# Patient Record
Sex: Female | Born: 2000 | Race: White | Hispanic: No | Marital: Single | Smoking: Never smoker
Health system: Southern US, Community
[De-identification: ages and names within clinical notes are randomized; demographics above are authoritative.]

## PROBLEM LIST (undated history)

## (undated) DIAGNOSIS — J45990 Exercise induced bronchospasm: Secondary | ICD-10-CM

## (undated) HISTORY — PX: RECONSTRUCTION OF NOSE: SHX2301

---
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
## 2021-06-26 ENCOUNTER — Encounter (HOSPITAL_BASED_OUTPATIENT_CLINIC_OR_DEPARTMENT_OTHER): Payer: Self-pay | Admitting: Emergency Medicine

## 2021-06-26 ENCOUNTER — Other Ambulatory Visit: Payer: Self-pay

## 2021-06-26 ENCOUNTER — Emergency Department (HOSPITAL_BASED_OUTPATIENT_CLINIC_OR_DEPARTMENT_OTHER)
Admission: EM | Admit: 2021-06-26 | Discharge: 2021-06-27 | Disposition: A | Discharge status: Home / Self Care | Payer: 59 | Attending: Emergency Medicine | Admitting: Emergency Medicine

## 2021-06-26 DIAGNOSIS — J45909 Unspecified asthma, uncomplicated: Secondary | ICD-10-CM | POA: Insufficient documentation

## 2021-06-26 DIAGNOSIS — T7840XA Allergy, unspecified, initial encounter: Secondary | ICD-10-CM

## 2021-06-26 DIAGNOSIS — Z20822 Contact with and (suspected) exposure to covid-19: Secondary | ICD-10-CM | POA: Insufficient documentation

## 2021-06-26 DIAGNOSIS — L509 Urticaria, unspecified: Secondary | ICD-10-CM | POA: Insufficient documentation

## 2021-06-26 DIAGNOSIS — R21 Rash and other nonspecific skin eruption: Secondary | ICD-10-CM | POA: Insufficient documentation

## 2021-06-26 DIAGNOSIS — T3695XA Adverse effect of unspecified systemic antibiotic, initial encounter: Secondary | ICD-10-CM | POA: Insufficient documentation

## 2021-06-26 DIAGNOSIS — T50905A Adverse effect of unspecified drugs, medicaments and biological substances, initial encounter: Secondary | ICD-10-CM

## 2021-06-26 LAB — CBC WITH DIFFERENTIAL/PLATELET
Abs Immature Granulocytes: 0.02 10*3/uL (ref 0.00–0.07)
Basophils Absolute: 0 10*3/uL (ref 0.0–0.1)
Basophils Relative: 1 %
Eosinophils Absolute: 0.3 10*3/uL (ref 0.0–0.5)
Eosinophils Relative: 6 %
HCT: 38.7 % (ref 36.0–46.0)
Hemoglobin: 13.3 g/dL (ref 12.0–15.0)
Immature Granulocytes: 1 %
Lymphocytes Relative: 24 %
Lymphs Abs: 1 10*3/uL (ref 0.7–4.0)
MCH: 31.1 pg (ref 26.0–34.0)
MCHC: 34.4 g/dL (ref 30.0–36.0)
MCV: 90.4 fL (ref 80.0–100.0)
Monocytes Absolute: 0.6 10*3/uL (ref 0.1–1.0)
Monocytes Relative: 14 %
Neutro Abs: 2.3 10*3/uL (ref 1.7–7.7)
Neutrophils Relative %: 54 %
Platelets: 307 10*3/uL (ref 150–400)
RBC: 4.28 MIL/uL (ref 3.87–5.11)
RDW: 12.3 % (ref 11.5–15.5)
WBC: 4.2 10*3/uL (ref 4.0–10.5)
nRBC: 0 % (ref 0.0–0.2)

## 2021-06-26 LAB — RESP PANEL BY RT-PCR (FLU A&B, COVID) ARPGX2
Influenza A by PCR: NEGATIVE
Influenza B by PCR: NEGATIVE
SARS Coronavirus 2 by RT PCR: NEGATIVE

## 2021-06-26 MED ORDER — FAMOTIDINE IN NACL 20-0.9 MG/50ML-% IV SOLN
20.0000 mg | Freq: Once | INTRAVENOUS | Status: AC
  Administered 2021-06-26: 20 mg via INTRAVENOUS
  Filled 2021-06-26: qty 50

## 2021-06-26 MED ORDER — ACETAMINOPHEN 325 MG PO TABS
650.0000 mg | ORAL_TABLET | Freq: Once | ORAL | Status: AC
  Administered 2021-06-26: 650 mg via ORAL
  Filled 2021-06-26: qty 2

## 2021-06-26 MED ORDER — METHYLPREDNISOLONE SODIUM SUCC 125 MG IJ SOLR
125.0000 mg | Freq: Once | INTRAMUSCULAR | Status: AC
  Administered 2021-06-26: 125 mg via INTRAVENOUS
  Filled 2021-06-26: qty 2

## 2021-06-26 NOTE — ED Triage Notes (Signed)
°  Patient comes in with allergic reaction that started earlier this morning.  Patient had scattered hives on arms, face, and legs.  Patient also states she has had chills since 1500 this afternoon and is febrile in triage to 100.8  Patient took benadryl at 1700 and helped hives.  States she has had sinus infection off and on for about a month.  No pain, but feels tired/weak.

## 2021-06-26 NOTE — ED Provider Notes (Signed)
[facility] EMERGENC[facility] DEPT Provider Note  CSN: [id number] Arrival date & time: 06/26/21 2050  Chief Complaint(s) Allergic Reaction and Fever  HPI Heather Frost is a 21 y.o. female    Allergic Reaction Presenting symptoms: itching and rash   Presenting symptoms: no difficulty breathing, no difficulty swallowing, no swelling and no wheezing   Presenting symptoms comment:  Fever Severity:  Moderate Duration:  5 hours Context: medications (finished 10 day course of Bactrim for recurring sinusitis)   Relieved by:  Nothing Worsened by:  Nothing Fever Associated symptoms: rash    Past Medical History Past Medical History:  Diagnosis Date   Asthma, exercise induced    Patient Active Problem List   Diagnosis Date Noted   Asthma, exercise induced    Home Medication(s) Prior to Admission medications   Medication Sig Start Date End Date Taking? Authorizing Provider  calcium carbonate (TUMS - DOSED IN MG ELEMENTAL CALCIUM) 500 MG chewable tablet Chew 1 tablet by mouth daily as needed for indigestion or heartburn.    [provider]  diphenhydrAMINE (BENADRYL) 25 MG tablet Take 1 tablet (25 mg total) by mouth every 6 (six) hours for 5 days. 06/27/21 07/02/21  Fatima Blank, MD  EPINEPHrine 0.3 mg/0.3 mL IJ SOAJ injection Inject 0.3 mg into the muscle as needed for anaphylaxis. 06/27/21   Fatima Blank, MD  famotidine (PEPCID) 20 MG tablet Take 1 tablet (20 mg total) by mouth daily for 5 days. 06/27/21 07/02/21  Fatima Blank, MD  Multiple Vitamin (MULTIVITAMIN WITH MINERALS) TABS tablet Take 1 tablet by mouth daily.    [provider]  predniSONE (DELTASONE) 10 MG tablet Take 4 tablets (40 mg total) by mouth daily for 4 days. 06/27/21 07/01/21  Fatima Blank, MD                                                                                                                                    Allergies Bactrim  [sulfamethoxazole-trimethoprim] and Penicillins  Review of Systems Review of Systems  Constitutional:  Positive for fever.  HENT:  Negative for trouble swallowing.   Respiratory:  Negative for wheezing.   Skin:  Positive for itching and rash.  As noted in HPI  Physical Exam Vital Signs  I have reviewed the triage vital signs BP 103/63    Pulse 81    Temp 99.3 F (37.4 C) (Oral)    Resp 18    Ht 5\' 4"  (1.626 m)    Wt 58.1 kg    LMP 06/26/2021    SpO2 99%    BMI 21.97 kg/m   Physical Exam Vitals reviewed.  Constitutional:      General: She is not in acute distress.    Appearance: She is well-developed. She is not diaphoretic.  HENT:     Head: Normocephalic and atraumatic.     Nose: Nose normal.     Mouth/Throat:  Lips: No lesions.     Mouth: No oral lesions or angioedema.     Dentition: No gum lesions.     Palate: No lesions.     Pharynx: No posterior oropharyngeal erythema.  Eyes:     General: No scleral icterus.       Right eye: No discharge.        Left eye: No discharge.     Conjunctiva/sclera: Conjunctivae normal.     Pupils: Pupils are equal, round, and reactive to light.  Cardiovascular:     Rate and Rhythm: Normal rate and regular rhythm.     Heart sounds: No murmur heard.   No friction rub. No gallop.  Pulmonary:     Effort: Pulmonary effort is normal. No respiratory distress.     Breath sounds: Normal breath sounds. No stridor. No rales.  Abdominal:     General: There is no distension.     Palpations: Abdomen is soft.     Tenderness: There is no abdominal tenderness.  Musculoskeletal:        General: No tenderness.     Cervical back: Normal range of motion and neck supple.  Skin:    General: Skin is warm and dry.     Findings: Rash present. No erythema. Rash is urticarial.  Neurological:     Mental Status: She is alert and oriented to person, place, and time.    ED Results and Treatments Labs (all labs ordered are listed, but only abnormal  results are displayed) Labs Reviewed  COMPREHENSIVE METABOLIC PANEL - Abnormal; Notable for the following components:      Result Value   Sodium 133 (*)    CO2 21 (*)    Glucose, Bld 114 (*)    Creatinine, Ser 1.07 (*)    All other components within normal limits  RESP PANEL BY RT-PCR (FLU A&B, COVID) ARPGX2  CBC WITH DIFFERENTIAL/PLATELET                                                                                                                         EKG  EKG Interpretation  Date/Time:    Ventricular Rate:    PR Interval:    QRS Duration:   QT Interval:    QTC Calculation:   R Axis:     Text Interpretation:         Radiology No results found.  Pertinent labs & imaging results that were available during my care of the patient were reviewed by me and considered in my medical decision making (see MDM for details).  Medications Ordered in ED Medications  acetaminophen (TYLENOL) tablet 650 mg (650 mg Oral Given 06/26/21 2109)  famotidine (PEPCID) IVPB 20 mg premix (0 mg Intravenous Stopped 06/27/21 0020)  methylPREDNISolone sodium succinate (SOLU-MEDROL) 125 mg/2 mL injection 125 mg (125 mg Intravenous Given 06/26/21 2344)  Procedures Procedures  (including critical care time)  Medical Decision Making / ED Course        21 y.o. female here with pruritic rash and fever. Likely from Bactrim. No respiratory, GI, or neurologic symptoms to suggest anaphylaxis. Patient has taken benadryl prior to arrival.  On exam, there is no evidence of oral swelling or airway compromise.   Drug reaction likely. No clinical evidence of SJS or TENS.  Work-up ordered to assess concerns above. Labs Independently interpreted by me and noted below: CBC without leukocytosis or anemia.  No thrombocytopenia.  No elevated eosinophil counts. CMP without significant  electrolyte derangements or renal sufficiency.  No evidence of transaminitis.  Management: IV pepcid and solumedrol monitor  Reassessment: Urticaria improving. No progression of symptoms requiring Epi use or admission.  Appropriate for outpatient management. Bactrim added to allergy list  Final Clinical Impression(s) / ED Diagnoses Final diagnoses:  Allergic reaction, initial encounter  Adverse effect of drug, initial encounter   The patient appears reasonably screened and/or stabilized for discharge and I doubt any other medical condition or other [facility] requiring further screening, evaluation, or treatment in the ED at this time prior to discharge. Safe for discharge with strict return precautions.  Disposition: Discharge  Condition: Good  I have discussed the results, Dx and Tx plan with the patient/family who expressed understanding and agree(s) with the plan. Discharge instructions discussed at length. The patient/family was given strict return precautions who verbalized understanding of the instructions. No further questions at time of discharge.    ED Discharge Orders          Ordered    diphenhydrAMINE (BENADRYL) 25 MG tablet  Every 6 hours        06/27/21 0203    EPINEPHrine 0.3 mg/0.3 mL IJ SOAJ injection  As needed        06/27/21 0203    famotidine (PEPCID) 20 MG tablet  Daily        06/27/21 0203    predniSONE (DELTASONE) 10 MG tablet  Daily        06/27/21 0203              Follow Up: Arlana Pouch, MD [address] [address] [phone]  Call  to schedule an appointment for close follow up           This chart was dictated using voice recognition software.  Despite best efforts to proofread,  errors can occur which can change the documentation meaning.    Fatima Blank, MD 06/27/21 [phone]

## 2021-06-27 LAB — COMPREHENSIVE METABOLIC PANEL
ALT: 18 U/L (ref 0–44)
AST: 19 U/L (ref 15–41)
Albumin: 3.9 g/dL (ref 3.5–5.0)
Alkaline Phosphatase: 40 U/L (ref 38–126)
Anion gap: 10 (ref 5–15)
BUN: 10 mg/dL (ref 6–20)
CO2: 21 mmol/L — ABNORMAL LOW (ref 22–32)
Calcium: 8.9 mg/dL (ref 8.9–10.3)
Chloride: 102 mmol/L (ref 98–111)
Creatinine, Ser: 1.07 mg/dL — ABNORMAL HIGH (ref 0.44–1.00)
GFR, Estimated: >60 mL/min (ref >60)
Glucose, Bld: 114 mg/dL — ABNORMAL HIGH (ref 70–99)
Potassium: 3.8 mmol/L (ref 3.5–5.1)
Sodium: 133 mmol/L — ABNORMAL LOW (ref 135–145)
Total Bilirubin: 0.3 mg/dL (ref 0.3–1.2)
Total Protein: 7 g/dL (ref 6.5–8.1)

## 2021-06-27 MED ORDER — DIPHENHYDRAMINE HCL 25 MG PO TABS: 25.0000 mg | ORAL_TABLET | Freq: Four times a day (QID) | ORAL | 0 refills | Status: DC

## 2021-06-27 MED ORDER — FAMOTIDINE 20 MG PO TABS: 20.0000 mg | ORAL_TABLET | Freq: Every day | ORAL | 0 refills | Status: DC

## 2021-06-27 MED ORDER — EPINEPHRINE 0.3 MG/0.3ML IJ SOAJ: 0.3000 mg | INTRAMUSCULAR | 0 refills | Status: DC | PRN

## 2021-06-27 MED ORDER — FAMOTIDINE 20 MG PO TABS: 20.0000 mg | ORAL_TABLET | Freq: Every day | ORAL | 0 refills | Status: AC

## 2021-06-27 MED ORDER — PREDNISONE 10 MG PO TABS: 40.0000 mg | ORAL_TABLET | Freq: Every day | ORAL | 0 refills | Status: AC

## 2021-06-27 MED ORDER — DIPHENHYDRAMINE HCL 25 MG PO TABS: 25.0000 mg | ORAL_TABLET | Freq: Four times a day (QID) | ORAL | 0 refills | Status: AC

## 2021-06-27 MED ORDER — EPINEPHRINE 0.3 MG/0.3ML IJ SOAJ: 0.3000 mg | INTRAMUSCULAR | 0 refills | Status: AC | PRN

## 2021-06-27 MED ORDER — PREDNISONE 10 MG PO TABS: 40.0000 mg | ORAL_TABLET | Freq: Every day | ORAL | 0 refills | Status: DC

## 2021-07-04 ENCOUNTER — Other Ambulatory Visit: Payer: Self-pay

## 2021-07-04 ENCOUNTER — Emergency Department (HOSPITAL_COMMUNITY): Payer: 59

## 2021-07-04 ENCOUNTER — Encounter (HOSPITAL_COMMUNITY): Payer: Self-pay

## 2021-07-04 ENCOUNTER — Emergency Department (HOSPITAL_COMMUNITY)
Admission: EM | Admit: 2021-07-04 | Discharge: 2021-07-05 | Disposition: A | Discharge status: Home / Self Care | Payer: 59 | Attending: Emergency Medicine | Admitting: Emergency Medicine

## 2021-07-04 DIAGNOSIS — R55 Syncope and collapse: Secondary | ICD-10-CM | POA: Insufficient documentation

## 2021-07-04 DIAGNOSIS — R112 Nausea with vomiting, unspecified: Secondary | ICD-10-CM | POA: Diagnosis not present

## 2021-07-04 DIAGNOSIS — R1084 Generalized abdominal pain: Secondary | ICD-10-CM | POA: Diagnosis not present

## 2021-07-04 DIAGNOSIS — Z20822 Contact with and (suspected) exposure to covid-19: Secondary | ICD-10-CM | POA: Insufficient documentation

## 2021-07-04 DIAGNOSIS — R7309 Other abnormal glucose: Secondary | ICD-10-CM | POA: Insufficient documentation

## 2021-07-04 DIAGNOSIS — R197 Diarrhea, unspecified: Secondary | ICD-10-CM | POA: Diagnosis not present

## 2021-07-04 LAB — RESP PANEL BY RT-PCR (FLU A&B, COVID) ARPGX2
Influenza A by PCR: NEGATIVE
Influenza B by PCR: NEGATIVE
SARS Coronavirus 2 by RT PCR: NEGATIVE

## 2021-07-04 LAB — CBC
HCT: 38.7 % (ref 36.0–46.0)
Hemoglobin: 13.2 g/dL (ref 12.0–15.0)
MCH: 31.5 pg (ref 26.0–34.0)
MCHC: 34.1 g/dL (ref 30.0–36.0)
MCV: 92.4 fL (ref 80.0–100.0)
Platelets: 455 10*3/uL — ABNORMAL HIGH (ref 150–400)
RBC: 4.19 MIL/uL (ref 3.87–5.11)
RDW: 12.3 % (ref 11.5–15.5)
WBC: 24.7 10*3/uL — ABNORMAL HIGH (ref 4.0–10.5)
nRBC: 0 % (ref 0.0–0.2)

## 2021-07-04 LAB — COMPREHENSIVE METABOLIC PANEL
ALT: 25 U/L (ref 0–44)
AST: 22 U/L (ref 15–41)
Albumin: 3.6 g/dL (ref 3.5–5.0)
Alkaline Phosphatase: 35 U/L — ABNORMAL LOW (ref 38–126)
Anion gap: 9 (ref 5–15)
BUN: 13 mg/dL (ref 6–20)
CO2: 23 mmol/L (ref 22–32)
Calcium: 8.1 mg/dL — ABNORMAL LOW (ref 8.9–10.3)
Chloride: 104 mmol/L (ref 98–111)
Creatinine, Ser: 0.83 mg/dL (ref 0.44–1.00)
GFR, Estimated: >60 mL/min (ref >60)
Glucose, Bld: 120 mg/dL — ABNORMAL HIGH (ref 70–99)
Potassium: 4.3 mmol/L (ref 3.5–5.1)
Sodium: 136 mmol/L (ref 135–145)
Total Bilirubin: 0.7 mg/dL (ref 0.3–1.2)
Total Protein: 6.5 g/dL (ref 6.5–8.1)

## 2021-07-04 LAB — I-STAT BETA HCG BLOOD, ED (MC, WL, AP ONLY): I-stat hCG, quantitative: <5 m[IU]/mL (ref <5)

## 2021-07-04 LAB — LIPASE, BLOOD: Lipase: 32 U/L (ref 11–51)

## 2021-07-04 LAB — CBG MONITORING, ED: Glucose-Capillary: 120 mg/dL — ABNORMAL HIGH (ref 70–99)

## 2021-07-04 MED ORDER — MORPHINE SULFATE (PF) 4 MG/ML IV SOLN
4.0000 mg | Freq: Once | INTRAVENOUS | Status: AC
  Administered 2021-07-04: 4 mg via INTRAVENOUS
  Filled 2021-07-04: qty 1

## 2021-07-04 MED ORDER — SODIUM CHLORIDE 0.9 % IV BOLUS
1000.0000 mL | Freq: Once | INTRAVENOUS | Status: AC
  Administered 2021-07-04: 1000 mL via INTRAVENOUS

## 2021-07-04 MED ORDER — IOHEXOL 300 MG/ML  SOLN
100.0000 mL | Freq: Once | INTRAMUSCULAR | Status: AC | PRN
  Administered 2021-07-04: 100 mL via INTRAVENOUS

## 2021-07-04 MED ORDER — ONDANSETRON HCL 4 MG/2ML IJ SOLN
4.0000 mg | Freq: Once | INTRAMUSCULAR | Status: AC
  Administered 2021-07-04: 4 mg via INTRAVENOUS
  Filled 2021-07-04: qty 2

## 2021-07-04 NOTE — ED Triage Notes (Signed)
Patient BIB [facility] from home with c/o N/V/D that began at 1730. Pt had syncopal episode, endorses lower abdominal pain.  1 L NS, , 4mg  zofran given en route.   18 g LAC

## 2021-07-04 NOTE — Discharge Instructions (Addendum)
I am prescribing you a medication called Zofran.  This is a disintegrating tablet you can use up to 3 times a day for management of your nausea and vomiting.  Please only take this as prescribed.  Please only take this if you are experiencing nausea and vomiting that you cannot control.  We have obtained stool studies at this visit.  If they are positive you will be contacted and will likely need to begin taking antibiotics.    Please continue to monitor your symptoms closely.  If you develop any new or worsening symptoms please come back to the emergency department.

## 2021-07-04 NOTE — ED Notes (Signed)
Medication delayed due to patient being unavailable.

## 2021-07-04 NOTE — ED Provider Notes (Signed)
[facility] DEPT Provider Note   CSN: [id number] Arrival date & time: 07/04/21  2107     History  Chief Complaint  Patient presents with   Abdominal Pain   Emesis    Heather Frost is a 21 y.o. female.  HPI Patient is a 21 year old female who presents to the emergency department due to nausea, vomiting, diarrhea that began around 5:30 PM this afternoon.  Patient seen in the ED 8 days ago with allergic reaction to what appeared to be Bactrim that she was taking for recurrent sinusitis.  She was discharged home in stable condition.  Today she was at work and ate shrimp for lunch.  She went home and was taking a nap and began experiencing sudden onset abdominal pain as well as nausea, vomiting, diarrhea.  States that her symptoms have been persistent since onset.  Denies any hematemesis or hematochezia.  She does note that she got in the shower after symptoms began and had a brief syncopal episode in which she fell to the floor.  She is unsure if she hit her head.  Denies a history of similar symptoms but does note "when she vomits she typically passes out".  Home Medications Prior to Admission medications   Medication Sig Start Date End Date Taking? Authorizing Provider  ondansetron (ZOFRAN-ODT) 4 MG disintegrating tablet Take 1 tablet (4 mg total) by mouth every 8 (eight) hours as needed for nausea or vomiting. 07/05/21  Yes Rayna Sexton, PA-C  calcium carbonate (TUMS - DOSED IN MG ELEMENTAL CALCIUM) 500 MG chewable tablet Chew 1 tablet by mouth daily as needed for indigestion or heartburn.    [provider]  diphenhydrAMINE (BENADRYL) 25 MG tablet Take 1 tablet (25 mg total) by mouth every 6 (six) hours for 5 days. 06/27/21 07/02/21  Fatima Blank, MD  EPINEPHrine 0.3 mg/0.3 mL IJ SOAJ injection Inject 0.3 mg into the muscle as needed for anaphylaxis. 06/27/21   Fatima Blank, MD  famotidine (PEPCID) 20 MG tablet Take 1 tablet (20 mg  total) by mouth daily for 5 days. 06/27/21 07/02/21  Fatima Blank, MD  Multiple Vitamin (MULTIVITAMIN WITH MINERALS) TABS tablet Take 1 tablet by mouth daily.    [provider]      Allergies    Bactrim [sulfamethoxazole-trimethoprim] and Penicillins    Review of Systems   Review of Systems  All other systems reviewed and are negative. Ten systems reviewed and are negative for acute change, except as noted in the HPI.   Physical Exam Updated Vital Signs BP 103/68    Pulse (!) 103    Temp 98.1 F (36.7 C) (Axillary)    Resp 16    LMP 06/26/2021    SpO2 99%  Physical Exam Vitals and nursing note reviewed.  Constitutional:      General: She is not in acute distress.    Appearance: Normal appearance. She is well-developed and normal weight. She is not ill-appearing, toxic-appearing or diaphoretic.  HENT:     Head: Normocephalic and atraumatic.     Right Ear: External ear normal.     Left Ear: External ear normal.     Nose: Nose normal.     Mouth/Throat:     Mouth: Mucous membranes are moist.     Pharynx: Oropharynx is clear. No oropharyngeal exudate or posterior oropharyngeal erythema.  Eyes:     Extraocular Movements: Extraocular movements intact.  Cardiovascular:     Rate and Rhythm: Normal rate  and regular rhythm.     Pulses: Normal pulses.     Heart sounds: Normal heart sounds. No murmur heard.   No friction rub. No gallop.  Pulmonary:     Effort: Pulmonary effort is normal. No respiratory distress.     Breath sounds: Normal breath sounds. No stridor. No wheezing, rhonchi or rales.  Abdominal:     General: Abdomen is flat.     Tenderness: There is generalized abdominal tenderness.     Comments: Abdomen is flat and soft.  Voluntary guarding.  Moderate tenderness noted diffusely across the abdomen that appears to be worse in the periumbilical region.  Musculoskeletal:        General: Normal range of motion.     Cervical back: Normal range of motion and neck  supple. No tenderness.  Skin:    General: Skin is warm and dry.  Neurological:     General: No focal deficit present.     Mental Status: She is alert and oriented to person, place, and time.  Psychiatric:        Mood and Affect: Mood normal.        Behavior: Behavior normal.   ED Results / Procedures / Treatments   Labs (all labs ordered are listed, but only abnormal results are displayed) Labs Reviewed  COMPREHENSIVE METABOLIC PANEL - Abnormal; Notable for the following components:      Result Value   Glucose, Bld 120 (*)    Calcium 8.1 (*)    Alkaline Phosphatase 35 (*)    All other components within normal limits  CBC - Abnormal; Notable for the following components:   WBC 24.7 (*)    Platelets 455 (*)    All other components within normal limits  CBG MONITORING, ED - Abnormal; Notable for the following components:   Glucose-Capillary 120 (*)    All other components within normal limits  RESP PANEL BY RT-PCR (FLU A&B, COVID) ARPGX2  C DIFFICILE QUICK SCREEN W PCR REFLEX    GASTROINTESTINAL PANEL BY PCR, STOOL (REPLACES STOOL CULTURE)  LIPASE, BLOOD  URINALYSIS, ROUTINE W REFLEX MICROSCOPIC  I-STAT BETA HCG BLOOD, ED ([facility], [facility], [facility] ONLY)   EKG EKG Interpretation  Date/Time:  Friday July 04 2021 23:05:25 EST Ventricular Rate:  108 PR Interval:  134 QRS Duration: 91 QT Interval:  333 QTC Calculation: 447 R Axis:   73 Text Interpretation: Sinus tachycardia RSR' in V1 or V2, right VCD or RVH Confirmed by Gerlene Fee [phone]) on 07/05/2021 2:05:37 AM  Radiology CT HEAD WO CONTRAST (5MM)  Result Date: 07/04/2021 CLINICAL DATA:  Syncope EXAM: CT HEAD WITHOUT CONTRAST TECHNIQUE: Contiguous axial images were obtained from the base of the skull through the vertex without intravenous contrast. RADIATION DOSE REDUCTION: This exam was performed according to the departmental dose-optimization program which includes automated exposure control, adjustment of the mA and/or kV  according to patient size and/or use of iterative reconstruction technique. COMPARISON:  None. FINDINGS: Brain: No evidence of acute infarction, hemorrhage, hydrocephalus, extra-axial collection or mass lesion/mass effect. Vascular: No hyperdense vessel or unexpected calcification. Skull: Normal. Negative for fracture or focal lesion. Sinuses/Orbits: The visualized paranasal sinuses are essentially clear. The mastoid air cells are unopacified. Other: None. IMPRESSION: Normal head CT. Electronically Signed   By: Julian Hy M.D.   On: 07/04/2021 23:31   CT ABDOMEN PELVIS W CONTRAST  Result Date: 07/04/2021 CLINICAL DATA:  Nausea/vomiting/diarrhea, lower abdominal pain EXAM: CT ABDOMEN AND PELVIS WITH CONTRAST TECHNIQUE: Multidetector CT imaging  of the abdomen and pelvis was performed using the standard protocol following bolus administration of intravenous contrast. RADIATION DOSE REDUCTION: This exam was performed according to the departmental dose-optimization program which includes automated exposure control, adjustment of the mA and/or kV according to patient size and/or use of iterative reconstruction technique. CONTRAST:  13m OMNIPAQUE IOHEXOL 300 MG/ML  SOLN COMPARISON:  None. FINDINGS: Lower chest: Lung bases are clear. Hepatobiliary: Liver is within normal limits. Enhancing nodularity at the gallbladder fundus (series 3/image 36) suggest gallbladder adenomyomatosis, benign. No intrahepatic or extrahepatic ductal dilatation. Pancreas: Within normal limits. Spleen: Within normal limits. Adrenals/Urinary Tract: Adrenal glands are within normal limits. Kidneys are within normal limits.  No hydronephrosis. Bladder is within normal limits. Stomach/Bowel: Stomach is decompressed. No evidence of bowel obstruction. Normal appendix (series 3/image 59). Fluid-filled colon, raising the possibility of a diarrheal process. No colonic wall thickening or inflammatory changes. Vascular/Lymphatic: No evidence of  abdominal aortic aneurysm. No suspicious abdominopelvic lymphadenopathy. Reproductive: Uterus is within normal limits. No adnexal masses. Other: No abdominopelvic ascites. Musculoskeletal: Visualized osseous structures are within normal limits. IMPRESSION: Fluid-filled colon, raising the possibility of a diarrheal process. 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 Otherwise negative CT abdomen/pelvis. Electronically Signed   By: SJulian HyM.D.   On: 07/04/2021 23:33    Procedures Procedures   Medications Ordered in ED Medications  sodium chloride 0.9 % bolus 1,000 mL (0 mLs Intravenous Stopped 07/05/21 0020)  ondansetron (ZOFRAN) injection 4 mg (4 mg Intravenous Given 07/04/21 2330)  morphine (PF) 4 MG/ML injection 4 mg (4 mg Intravenous Given 07/04/21 2331)  iohexol (OMNIPAQUE) 300 MG/ML solution 100 mL (100 mLs Intravenous Contrast Given 07/04/21 2309)  morphine (PF) 2 MG/ML injection 2 mg (2 mg Intravenous Given 07/05/21 0021)  lactated ringers bolus 1,000 mL (1,000 mLs Intravenous New Bag/Given 07/05/21 0243)   ED Course/ Medical Decision Making/ A&P                           Medical Decision Making Amount and/or Complexity of Data Reviewed Labs: ordered. Radiology: ordered. ECG/medicine tests: ordered.  Risk Prescription drug management.  Pt is a 21y.o. female who presents to the emergency department due to nausea, vomiting, and diarrhea that began yesterday afternoon after eating shrimp.  Reports a syncopal episode in the shower while vomiting.  Labs: CBC with a white count of 24.7 and platelets of 455. CMP with a glucose of 120, calcium of 8.1, alk phos of 35. CBG of 120. Respiratory panel is negative. Lipase of 32. I-STAT beta-hCG less than 5. C. difficile quick screen with PCR reflex is negative. Gastrointestinal panel by PCR is in process.  Imaging: CT scan of the abdomen/pelvis with contrast showing IMPRESSION: Fluid-filled colon, raising the possibility of a  diarrheal process. 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 Otherwise negative CT abdomen/pelvis. Electronically Signed   By: SJulian HyM.D.   On: 07/04/2021 23:33    CT scan of the head without contrast IMPRESSION: Normal head CT. Electronically Signed   By: SJulian HyM.D.   On: 07/04/2021 23:31   I, LRayna Sexton PA-C, personally reviewed and evaluated these images and lab results as part of my medical decision-making.  Patient reports syncopal episode during a bout of vomiting earlier today.  Unsure of head trauma.  Normal neuro exam.  No red flags.  She notes that she is continuing to have near syncopal episodes when vomiting today.  I obtained a CT scan of  her head which was a "normal head CT".  Appears consistent with vasovagal syncope versus orthostatic hypotension.  Patient given 1 L of IV fluids with EMS and 2 L since coming to the emergency department.  Appears that her symptoms have moderately improved.  No continued syncopal episodes.  Patient is ambulatory.  Patient also with recurrent nausea, vomiting, and diarrhea.  She was having moderate tenderness diffusely across her abdomen.  I obtained a CT scan of her abdomen/pelvis which shows "fluid-filled colon, raising the possibility of a diarrheal process".  They also note a benign gallbladder adenomyomatosis.  This was discussed with the patient.  It is otherwise a negative CT scan of the abdomen/pelvis.  Patient treated with Zofran as well as morphine.  Notes moderate improvement in her symptoms.  Now tolerating p.o. intake.  She has no recent antibiotic use so a C. difficile panel was obtained which is negative.  Stool PCR is pending.  Feel the patient is stable for discharge at this time and she is agreeable.  Will discharge on a course of Zofran.  We discussed return precautions.  Her questions were answered and she was amicable at the time of discharge.  Note: Portions of this report may have been transcribed  using voice recognition software. Every effort was made to ensure accuracy; however, inadvertent computerized transcription errors may be present.   Final Clinical Impression(s) / ED Diagnoses Final diagnoses:  Nausea vomiting and diarrhea  Syncope, unspecified syncope type   Rx / DC Orders ED Discharge Orders          Ordered    ondansetron (ZOFRAN-ODT) 4 MG disintegrating tablet  Every 8 hours PRN        07/05/21 0416              Rayna Sexton, PA-C 07/05/21 0426    Maudie Flakes, MD 07/05/21 [phone]

## 2021-07-04 NOTE — ED Provider Triage Note (Signed)
Emergency Medicine Provider Triage Evaluation Note  Heather Frost , a 21 y.o. female  was evaluated in triage.  Pt complains of .  Abdominal pain. She ate shrimp at around noon, she has previously eaten shrimp without difficulty.  At about 5 PM she developed lower abdominal pain, has had significant nausea and vomiting since.  She denies any shortness of breath, throat swelling, rash or itchiness.  In the midst of all her vomiting she did have a syncopal event according to EMS.  She was given IV fluids by EMS, along with the Zofran.  Physical Exam  BP 101/65 (BP Location: Right Arm)    Pulse 88    Temp 98.1 F (36.7 C) (Axillary)    Resp 19    LMP 06/26/2021    SpO2 99%  Gen:   Awake, no distress appears to feel unwell, is dry heaving Resp:  Normal effort  MSK:   Moves extremities without difficulty  Other:  No rash or skin color change   Medical Decision Making  Medically screening exam initiated at 9:33 PM.  Appropriate orders placed.  Donica B Manansala was informed that the remainder of the evaluation will be completed by another provider, this initial triage assessment does not replace that evaluation, and the importance of remaining in the ED until their evaluation is complete.     Cristina Gong, [place] 07/04/21 2135

## 2021-07-05 LAB — GASTROINTESTINAL PANEL BY PCR, STOOL (REPLACES STOOL CULTURE)

## 2021-07-05 LAB — C DIFFICILE QUICK SCREEN W PCR REFLEX
C Diff antigen: NEGATIVE
C Diff interpretation: NOT DETECTED
C Diff toxin: NEGATIVE

## 2021-07-05 MED ORDER — MORPHINE SULFATE (PF) 2 MG/ML IV SOLN
2.0000 mg | Freq: Once | INTRAVENOUS | Status: AC
  Administered 2021-07-05: 2 mg via INTRAVENOUS
  Filled 2021-07-05: qty 1

## 2021-07-05 MED ORDER — ONDANSETRON 4 MG PO TBDP: 4.0000 mg | ORAL_TABLET | Freq: Three times a day (TID) | ORAL | 0 refills | Status: AC | PRN

## 2021-07-05 MED ORDER — LACTATED RINGERS IV BOLUS
1000.0000 mL | Freq: Once | INTRAVENOUS | Status: AC
  Administered 2021-07-05: 1000 mL via INTRAVENOUS

## 2021-07-05 NOTE — ED Notes (Addendum)
Patient was given water. Patient has no complaints about nausea/vomiting.

## 2021-07-05 NOTE — ED Notes (Signed)
Call received from lab about positive norovirus, lab called to wrong hospital, sherrill in lab notified

## 2023-01-17 IMAGING — CT CT HEAD W/O CM
3 of 4 series · 14 of 47 positions shown, 16 images · non-contrast
Comparison: None.

CLINICAL DATA: Syncope



[Series 6: coronal soft tissue · coronal · 0.31mm/px · 3 of 63 slices shown]
[im 21/63  brain]
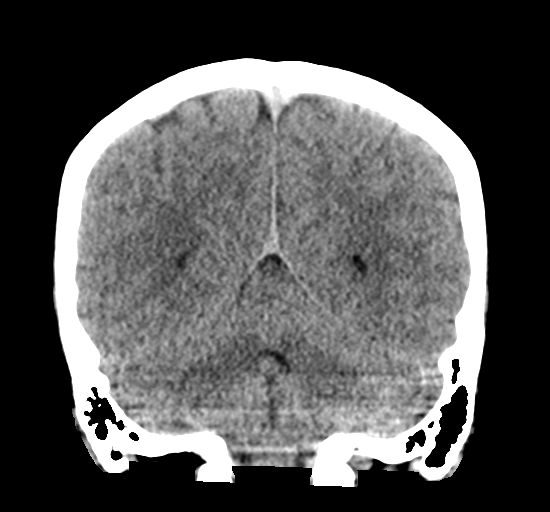
[im 28/63  brain]
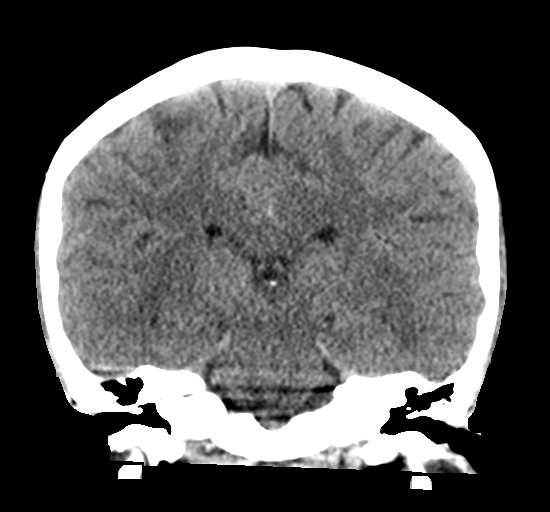
[im 35/63  brain]
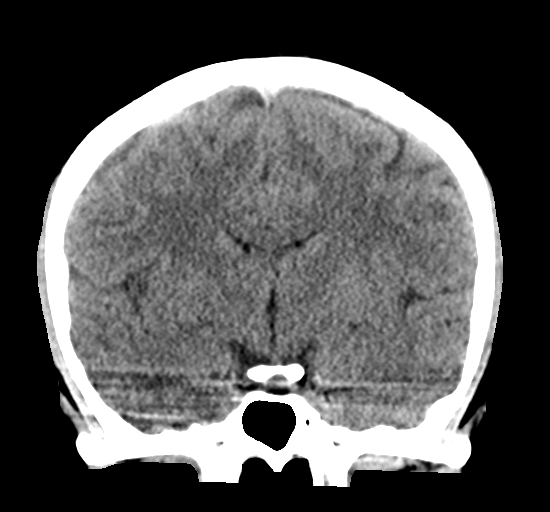

[Series 7: sagittal soft tissue · sagittal · 0.31mm/px · 3 of 58 slices shown]
[im 20/58  brain]
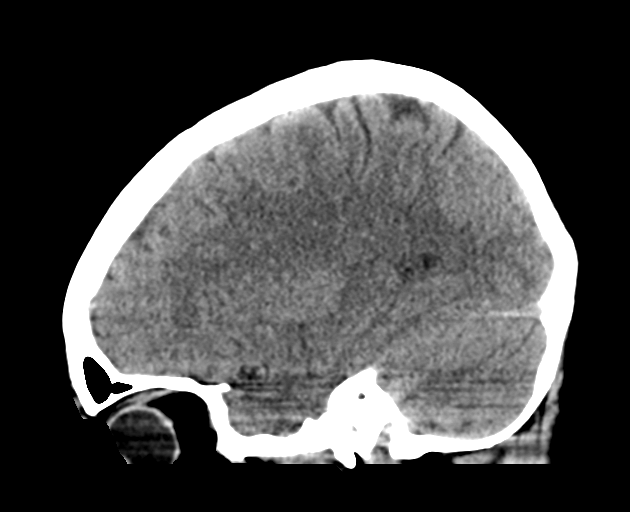
[im 29/58  brain]
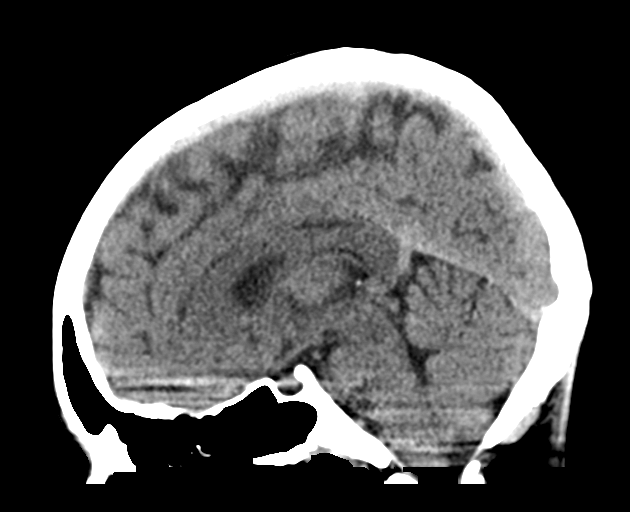
[im 39/58  brain]
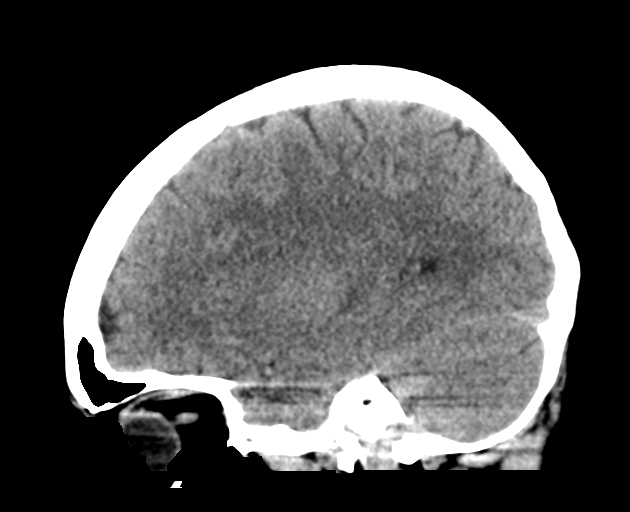

[Series 8: true axial · axial · 0.34mm/px · z∈[-129,-13]mm · 8 of 51 slices shown, 10 images]
[im 6/51  brain]
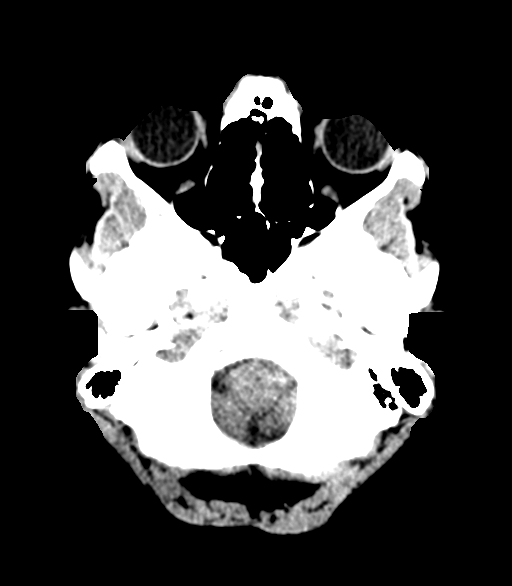
[im 6/51  bone]
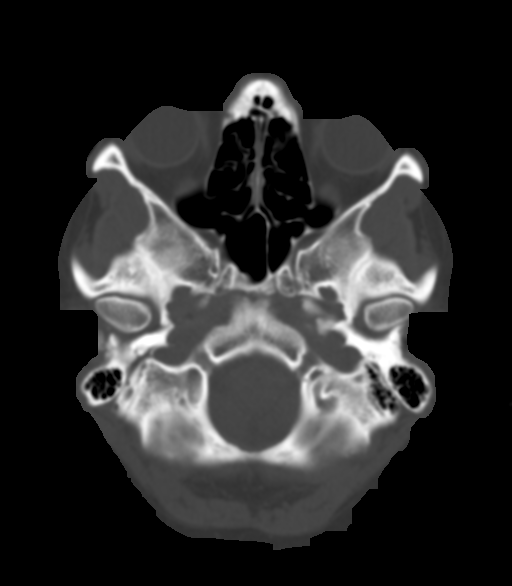
[im 12/51  brain]
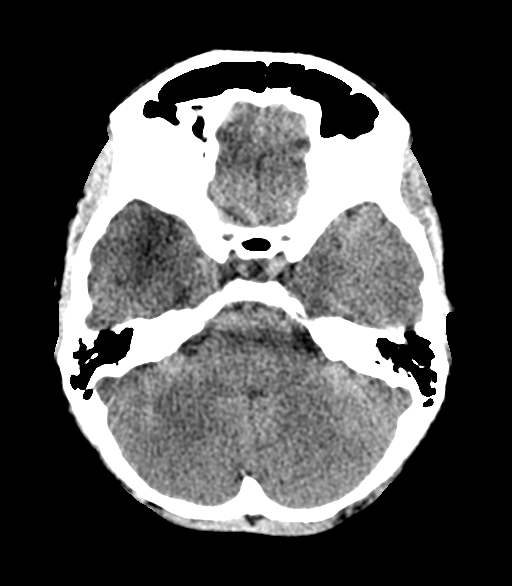
[im 17/51  brain]
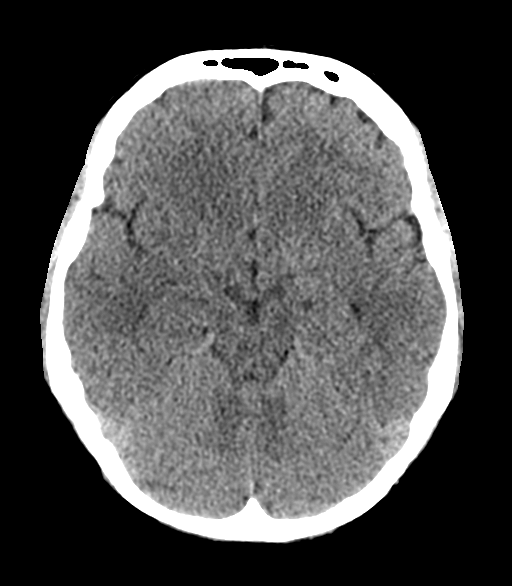
[im 23/51  brain]
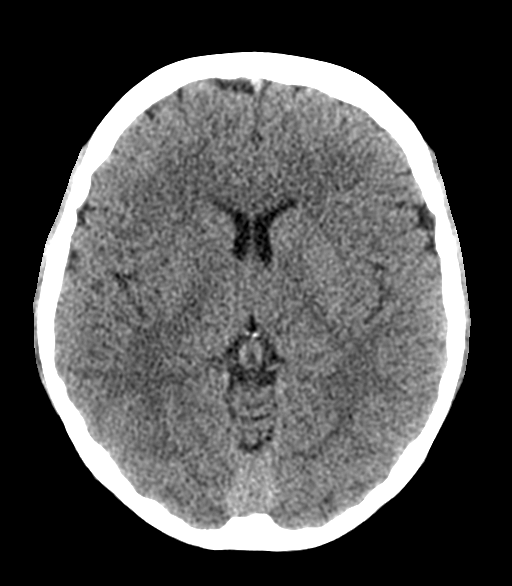
[im 28/51  brain]
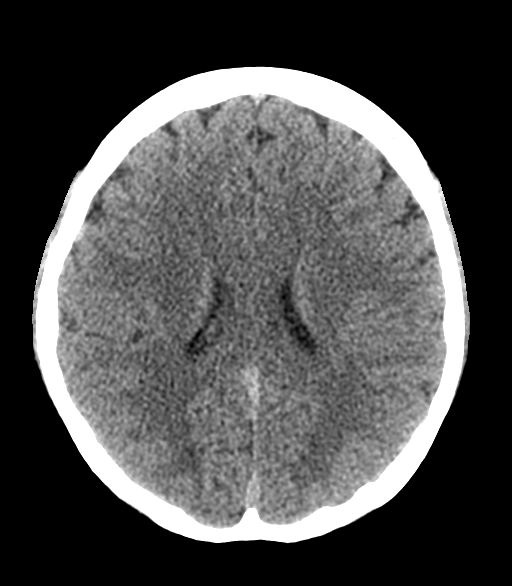
[im 28/51  bone]
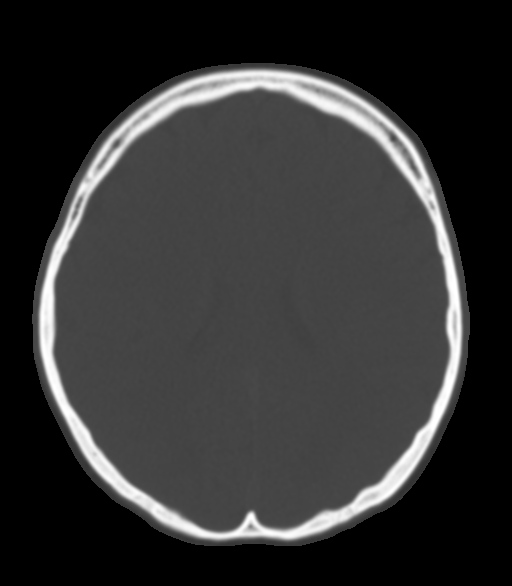
[im 34/51  brain]
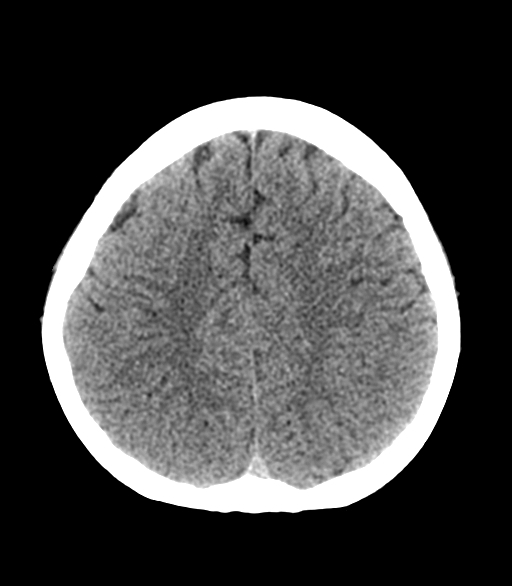
[im 39/51  brain]
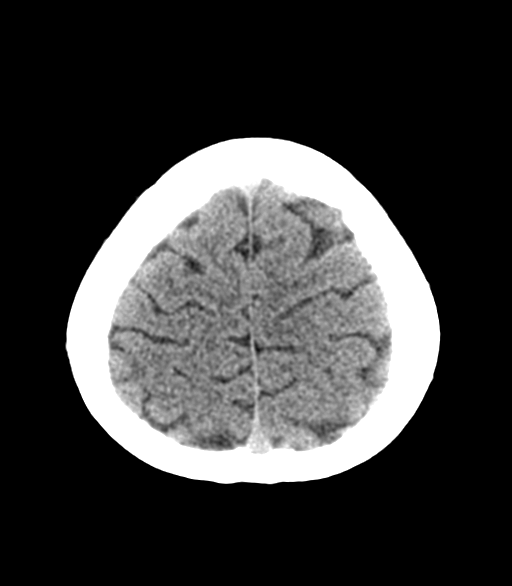
[im 45/51  brain]
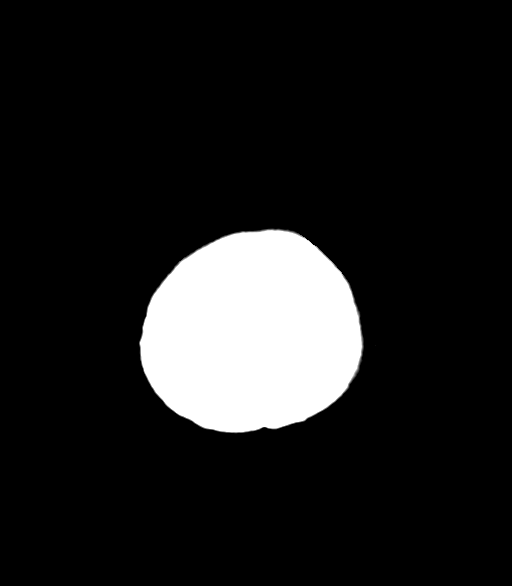

[14 of 47 positions shown; findings below may reference images not displayed]

FINDINGS: Brain: No evidence of acute infarction, hemorrhage, hydrocephalus,
extra-axial collection or mass lesion/mass effect.

Vascular: No hyperdense vessel or unexpected calcification.

Skull: Normal. Negative for fracture or focal lesion.

Sinuses/Orbits: The visualized paranasal sinuses are essentially
clear. The mastoid air cells are unopacified.

Other: None.
IMPRESSION: Normal head CT.
